# Patient Record
Sex: Female | Born: 1980 | Race: White | Hispanic: No | Marital: Married | Smoking: Former smoker
Health system: Southern US, Community
[De-identification: ages and names within clinical notes are randomized; demographics above are authoritative.]

## PROBLEM LIST (undated history)

## (undated) ENCOUNTER — Inpatient Hospital Stay (HOSPITAL_COMMUNITY): Payer: Self-pay

## (undated) DIAGNOSIS — I10 Essential (primary) hypertension: Secondary | ICD-10-CM

## (undated) HISTORY — PX: OTHER SURGICAL HISTORY: SHX169

## (undated) HISTORY — PX: TONSILLECTOMY: SUR1361

---
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
## 2002-12-14 ENCOUNTER — Other Ambulatory Visit: Admission: RE | Admit: 2002-12-14 | Discharge: 2002-12-14 | Payer: Self-pay | Admitting: Obstetrics and Gynecology

## 2003-12-15 ENCOUNTER — Other Ambulatory Visit: Admission: RE | Admit: 2003-12-15 | Discharge: 2003-12-15 | Payer: Self-pay | Admitting: Obstetrics and Gynecology

## 2004-08-07 ENCOUNTER — Other Ambulatory Visit: Admission: RE | Admit: 2004-08-07 | Discharge: 2004-08-07 | Payer: Self-pay | Admitting: Obstetrics and Gynecology

## 2005-03-04 ENCOUNTER — Other Ambulatory Visit: Admission: RE | Admit: 2005-03-04 | Discharge: 2005-03-04 | Payer: Self-pay | Admitting: Obstetrics and Gynecology

## 2013-03-10 LAB — OB RESULTS CONSOLE RUBELLA ANTIBODY, IGM: Rubella: IMMUNE

## 2013-03-10 LAB — OB RESULTS CONSOLE ANTIBODY SCREEN: Antibody Screen: NEGATIVE

## 2013-03-10 LAB — OB RESULTS CONSOLE HEPATITIS B SURFACE ANTIGEN: Hepatitis B Surface Ag: NEGATIVE

## 2013-03-10 LAB — OB RESULTS CONSOLE ABO/RH: RH Type: POSITIVE

## 2013-03-10 LAB — OB RESULTS CONSOLE RPR: RPR: NONREACTIVE

## 2013-03-10 LAB — OB RESULTS CONSOLE HIV ANTIBODY (ROUTINE TESTING): HIV: NONREACTIVE

## 2013-03-23 LAB — OB RESULTS CONSOLE GC/CHLAMYDIA
Chlamydia: NEGATIVE
Gonorrhea: NEGATIVE

## 2013-09-10 ENCOUNTER — Encounter (HOSPITAL_COMMUNITY): Payer: Self-pay | Admitting: *Deleted

## 2013-09-10 ENCOUNTER — Inpatient Hospital Stay (HOSPITAL_COMMUNITY)
Admission: AD | Admit: 2013-09-10 | Discharge: 2013-09-10 | Disposition: A | Discharge status: Home / Self Care | Payer: 59 | Source: Ambulatory Visit | Attending: Obstetrics and Gynecology | Admitting: Obstetrics and Gynecology

## 2013-09-10 ENCOUNTER — Inpatient Hospital Stay (HOSPITAL_COMMUNITY): Payer: 59

## 2013-09-10 DIAGNOSIS — Z87891 Personal history of nicotine dependence: Secondary | ICD-10-CM

## 2013-09-10 DIAGNOSIS — M549 Dorsalgia, unspecified: Secondary | ICD-10-CM

## 2013-09-10 DIAGNOSIS — IMO0001 Reserved for inherently not codable concepts without codable children: Secondary | ICD-10-CM

## 2013-09-10 DIAGNOSIS — O4700 False labor before 37 completed weeks of gestation, unspecified trimester: Secondary | ICD-10-CM

## 2013-09-10 DIAGNOSIS — M7918 Myalgia, other site: Secondary | ICD-10-CM

## 2013-09-10 HISTORY — DX: Essential (primary) hypertension: I10

## 2013-09-10 LAB — URINALYSIS, ROUTINE W REFLEX MICROSCOPIC
Bilirubin Urine: NEGATIVE
Glucose, UA: NEGATIVE mg/dL
Hgb urine dipstick: NEGATIVE
Ketones, ur: 40 mg/dL — AB
Leukocytes, UA: NEGATIVE
Nitrite: NEGATIVE
PROTEIN: NEGATIVE mg/dL
Specific Gravity, Urine: 1.02 (ref 1.005–1.030)
UROBILINOGEN UA: 0.2 mg/dL (ref 0.0–1.0)
pH: 6.5 (ref 5.0–8.0)

## 2013-09-10 MED ORDER — CYCLOBENZAPRINE HCL 5 MG PO TABS
5.0000 mg | ORAL_TABLET | Freq: Once | ORAL | Status: AC
  Administered 2013-09-10: 5 mg via ORAL
  Filled 2013-09-10: qty 1

## 2013-09-10 MED ORDER — CYCLOBENZAPRINE HCL 10 MG PO TABS: 10.0000 mg | ORAL_TABLET | Freq: Two times a day (BID) | ORAL | Status: DC | PRN

## 2013-09-10 MED ORDER — ONDANSETRON 8 MG PO TBDP
8.0000 mg | ORAL_TABLET | Freq: Once | ORAL | Status: AC
  Administered 2013-09-10: 8 mg via ORAL
  Filled 2013-09-10: qty 1

## 2013-09-10 NOTE — MAU Note (Signed)
PT  SAYS SHE  STARTED HAVING PAIN ON LEFT LOWER BACK. ON Thursday NIGHT     FEEELS WORSE NOW  THAN THEN.    HAS HX OF UTI-  HAS INCREASE FREQ ALL  PREG.    NO VE IN OFFICE.  DENIES HSV AND MRSA.

## 2013-09-10 NOTE — MAU Provider Note (Signed)
History     CSN: [id number]  Arrival date and time: 09/10/13 [id number]   First Provider Initiated Contact with Patient 09/10/13 0522      No chief complaint on file.  HPI Ms. Collier BullockLauren Bole is a 33 y.o. G2P0 at 4984w4d who presents to MAU today with complaint of left sided mid-back pain. The patient states that pain started on Thursday and has become worse since onset. The also endorses occasional dysuria and frequency of urination. She denies urgency, incomplete emptying or hematuria. She had had mild occasional contractions. She denies LOF, discharge, vaginal bleeding or fever. Blood pressure was slightly elevated upon admission. Patient denies previous issues with HTN. She denies headache, blurred vision or floaters or RUQ pain. She does have BLE edema that she states improves with rest.    OB History   Grav Para Term Preterm Abortions TAB SAB Ect Mult Living   2               Past Medical History  Diagnosis Date  . Hypertension     no meds    Past Surgical History  Procedure Laterality Date  . Tonsillectomy    . Wisdom teetth    . Adnoids      History reviewed. No pertinent family history.  History  Substance Use Topics  . Smoking status: Former Games developermoker  . Smokeless tobacco: Not on file  . Alcohol Use: No    Allergies:  Allergies  Allergen Reactions  . Peanuts [Peanut Oil] Other (See Comments)    TESTED  POSTIVE    Prescriptions prior to admission  Medication Sig Dispense Refill  . Multiple Vitamin (MULTIVITAMIN) tablet Take 1 tablet by mouth daily.        Review of Systems  Constitutional: Negative for fever and malaise/fatigue.  Eyes: Negative for blurred vision and double vision.  Cardiovascular: Positive for leg swelling.  Gastrointestinal: Positive for nausea. Negative for vomiting, abdominal pain, diarrhea and constipation.  Genitourinary: Positive for dysuria, frequency and flank pain. Negative for urgency and hematuria.  Neurological: Negative for  headaches.   Physical Exam   Blood pressure 137/88, pulse 107, temperature 98.5 F (36.9 C), temperature source Oral, height 5\' 3"  (1.6 m), weight 185 lb 8 oz (84.142 kg).  Physical Exam  Constitutional: She is oriented to person, place, and time. She appears well-developed and well-nourished. No distress.  HENT:  Head: Normocephalic and atraumatic.  Cardiovascular: Regular rhythm and normal heart sounds.  Tachycardia present.   Respiratory: Effort normal and breath sounds normal. No respiratory distress.  GI: Soft. She exhibits no distension and no mass. There is no tenderness. There is no rebound and no guarding.  Musculoskeletal: She exhibits edema (1+ pitting to the knee bilateraly).  Neurological: She is alert and oriented to person, place, and time.  Reflex Scores:      Bicep reflexes are 2+ on the right side and 2+ on the left side.      Brachioradialis reflexes are 2+ on the right side and 2+ on the left side.      Patellar reflexes are 3+ on the right side and 3+ on the left side.      Achilles reflexes are 2+ on the right side and 2+ on the left side. No clonus  Skin: Skin is warm and dry. No erythema.  Psychiatric: She has a normal mood and affect.  Dilation: Fingertip  Results for orders placed during the hospital encounter of 09/10/13 (from the past 24 hour(s))  URINALYSIS, ROUTINE W REFLEX MICROSCOPIC     Status: Abnormal   Collection Time    09/10/13  5:00 AM      Result Value Ref Range   Color, Urine YELLOW  YELLOW   APPearance CLEAR  CLEAR   Specific Gravity, Urine 1.020  1.005 - 1.030   pH 6.5  5.0 - 8.0   Glucose, UA NEGATIVE  NEGATIVE mg/dL   Hgb urine dipstick NEGATIVE  NEGATIVE   Bilirubin Urine NEGATIVE  NEGATIVE   Ketones, ur 40 (*) NEGATIVE mg/dL   Protein, ur NEGATIVE  NEGATIVE mg/dL   Urobilinogen, UA 0.2  0.0 - 1.0 mg/dL   Nitrite NEGATIVE  NEGATIVE   Leukocytes, UA NEGATIVE  NEGATIVE   Fetal Monitoring: Baseline: 150 bpm, moderate variability,  + accelerations, no decelerations Contractions: mild, q 4 -10 minutes  MAU Course  Procedures None  MDM UA today Discussed patient with Dr. Thana AtesGrewel. Get BPP and check cervix.  BPP today - 8/8 Check cervix - fingertip, thick Discussed results and patient status with Dr. Corinna LinesGrewal. Ok for offer Flexeril. Discuss PTL precautions and patient can follow-up in the office as scheduled Patient had one episode of emesis while in MAU ODT Zofran given prior to Flexeril Dr. Vincente PoliGrewal to MAU to see the patient at 0820; orders received. Ok to discharge patient home.   Assessment and Plan  A: Preterm contractions Back pain in pregnancy, third trimester Musculoskeletal pain in pregnancy   P: Discharge home Rx for Flexeril given to patient Discussed preterm labor precautions Patient advised to follow-up with Dr. Thana AtesGrewel as scheduled or sooner if symptoms persist or worsen Patient may return to MAU as needed or if her condition were to change or worsen  Freddi StarrJulie N Ethier, PA-C  09/10/2013, 7:27 AM   Iona HansenJennifer Irene Kazuto Sevey, NP 09/10/2013. 8:32 AM

## 2013-09-22 LAB — OB RESULTS CONSOLE GBS: STREP GROUP B AG: NEGATIVE

## 2013-10-13 ENCOUNTER — Inpatient Hospital Stay (HOSPITAL_COMMUNITY)
Admission: AD | Admit: 2013-10-13 | Discharge: 2013-10-18 | DRG: 765 | Disposition: A | Discharge status: Home / Self Care | Payer: 59 | Source: Ambulatory Visit | Attending: Obstetrics & Gynecology | Admitting: Obstetrics & Gynecology

## 2013-10-13 ENCOUNTER — Encounter (HOSPITAL_COMMUNITY): Payer: Self-pay | Admitting: *Deleted

## 2013-10-13 ENCOUNTER — Inpatient Hospital Stay (HOSPITAL_COMMUNITY): Payer: 59 | Admitting: Anesthesiology

## 2013-10-13 ENCOUNTER — Encounter (HOSPITAL_COMMUNITY)
Admission: AD | Disposition: A | Discharge status: Home / Self Care | Payer: Self-pay | Source: Ambulatory Visit | Attending: Obstetrics & Gynecology

## 2013-10-13 ENCOUNTER — Encounter (HOSPITAL_COMMUNITY): Payer: 59 | Admitting: Anesthesiology

## 2013-10-13 DIAGNOSIS — O479 False labor, unspecified: Secondary | ICD-10-CM | POA: Diagnosis present

## 2013-10-13 DIAGNOSIS — Z87891 Personal history of nicotine dependence: Secondary | ICD-10-CM

## 2013-10-13 DIAGNOSIS — IMO0002 Reserved for concepts with insufficient information to code with codable children: Secondary | ICD-10-CM | POA: Diagnosis not present

## 2013-10-13 DIAGNOSIS — J45909 Unspecified asthma, uncomplicated: Secondary | ICD-10-CM | POA: Diagnosis present

## 2013-10-13 DIAGNOSIS — Z98891 History of uterine scar from previous surgery: Secondary | ICD-10-CM

## 2013-10-13 DIAGNOSIS — O324XX0 Maternal care for high head at term, not applicable or unspecified: Secondary | ICD-10-CM | POA: Diagnosis present

## 2013-10-13 LAB — CBC
HCT: 37.2 % (ref 36.0–46.0)
HEMOGLOBIN: 12.5 g/dL (ref 12.0–15.0)
MCH: 29.2 pg (ref 26.0–34.0)
MCHC: 33.6 g/dL (ref 30.0–36.0)
MCV: 86.9 fL (ref 78.0–100.0)
Platelets: 268 10*3/uL (ref 150–400)
RBC: 4.28 MIL/uL (ref 3.87–5.11)
RDW: 13.2 % (ref 11.5–15.5)
WBC: 11.3 10*3/uL — AB (ref 4.0–10.5)

## 2013-10-13 LAB — RPR

## 2013-10-13 LAB — POCT FERN TEST: POCT Fern Test: POSITIVE

## 2013-10-13 SURGERY — Surgical Case
Anesthesia: Epidural | Site: Abdomen

## 2013-10-13 MED ORDER — PRENATAL MULTIVITAMIN CH
1.0000 | ORAL_TABLET | Freq: Every day | ORAL | Status: DC
  Administered 2013-10-14 – 2013-10-18 (×5): 1 via ORAL
  Filled 2013-10-13 (×5): qty 1

## 2013-10-13 MED ORDER — PHENYLEPHRINE 40 MCG/ML (10ML) SYRINGE FOR IV PUSH (FOR BLOOD PRESSURE SUPPORT)
80.0000 ug | PREFILLED_SYRINGE | INTRAVENOUS | Status: DC | PRN
  Filled 2013-10-13: qty 2

## 2013-10-13 MED ORDER — SENNOSIDES-DOCUSATE SODIUM 8.6-50 MG PO TABS
2.0000 | ORAL_TABLET | ORAL | Status: DC
  Administered 2013-10-14 – 2013-10-17 (×5): 2 via ORAL
  Filled 2013-10-13 (×5): qty 2

## 2013-10-13 MED ORDER — ONDANSETRON HCL 4 MG/2ML IJ SOLN
INTRAMUSCULAR | Status: AC
  Filled 2013-10-13: qty 2

## 2013-10-13 MED ORDER — TERBUTALINE SULFATE 1 MG/ML IJ SOLN: 0.2500 mg | Freq: Once | INTRAMUSCULAR | Status: DC | PRN

## 2013-10-13 MED ORDER — SODIUM BICARBONATE 8.4 % IV SOLN
INTRAVENOUS | Status: AC
  Filled 2013-10-13: qty 50

## 2013-10-13 MED ORDER — OXYTOCIN 10 UNIT/ML IJ SOLN
40.0000 [IU] | INTRAVENOUS | Status: DC | PRN
  Administered 2013-10-13: 40 [IU] via INTRAVENOUS

## 2013-10-13 MED ORDER — PHENYLEPHRINE 40 MCG/ML (10ML) SYRINGE FOR IV PUSH (FOR BLOOD PRESSURE SUPPORT)
PREFILLED_SYRINGE | INTRAVENOUS | Status: AC
  Filled 2013-10-13: qty 10

## 2013-10-13 MED ORDER — DIPHENHYDRAMINE HCL 25 MG PO CAPS: 25.0000 mg | ORAL_CAPSULE | Freq: Four times a day (QID) | ORAL | Status: DC | PRN

## 2013-10-13 MED ORDER — NALOXONE HCL 0.4 MG/ML IJ SOLN: 0.4000 mg | INTRAMUSCULAR | Status: DC | PRN

## 2013-10-13 MED ORDER — ONDANSETRON HCL 4 MG/2ML IJ SOLN: 4.0000 mg | Freq: Three times a day (TID) | INTRAMUSCULAR | Status: DC | PRN

## 2013-10-13 MED ORDER — FENTANYL 2.5 MCG/ML BUPIVACAINE 1/10 % EPIDURAL INFUSION (WH - ANES)
INTRAMUSCULAR | Status: AC
  Administered 2013-10-13: 14 mL/h via EPIDURAL
  Filled 2013-10-13: qty 125

## 2013-10-13 MED ORDER — ONDANSETRON HCL 4 MG PO TABS
4.0000 mg | ORAL_TABLET | ORAL | Status: DC | PRN
  Administered 2013-10-15: 4 mg via ORAL
  Filled 2013-10-13: qty 1

## 2013-10-13 MED ORDER — EPHEDRINE 5 MG/ML INJ
10.0000 mg | INTRAVENOUS | Status: DC | PRN
  Filled 2013-10-13: qty 2

## 2013-10-13 MED ORDER — METOCLOPRAMIDE HCL 5 MG/ML IJ SOLN
10.0000 mg | Freq: Three times a day (TID) | INTRAMUSCULAR | Status: DC | PRN
  Administered 2013-10-13: 10 mg via INTRAVENOUS
  Filled 2013-10-13: qty 2

## 2013-10-13 MED ORDER — EPHEDRINE 5 MG/ML INJ
INTRAVENOUS | Status: AC
  Filled 2013-10-13: qty 4

## 2013-10-13 MED ORDER — NALOXONE HCL 1 MG/ML IJ SOLN
1.0000 ug/kg/h | INTRAVENOUS | Status: DC | PRN
  Filled 2013-10-13: qty 2

## 2013-10-13 MED ORDER — MEPERIDINE HCL 25 MG/ML IJ SOLN: 6.2500 mg | INTRAMUSCULAR | Status: DC | PRN

## 2013-10-13 MED ORDER — SIMETHICONE 80 MG PO CHEW
80.0000 mg | CHEWABLE_TABLET | Freq: Three times a day (TID) | ORAL | Status: DC
  Administered 2013-10-14 – 2013-10-18 (×11): 80 mg via ORAL
  Filled 2013-10-13 (×11): qty 1

## 2013-10-13 MED ORDER — PROMETHAZINE HCL 25 MG/ML IJ SOLN: 6.2500 mg | INTRAMUSCULAR | Status: DC | PRN

## 2013-10-13 MED ORDER — LACTATED RINGERS IV SOLN
INTRAVENOUS | Status: DC
  Administered 2013-10-15 – 2013-10-17 (×5): via INTRAVENOUS

## 2013-10-13 MED ORDER — LACTATED RINGERS IV SOLN
500.0000 mL | Freq: Once | INTRAVENOUS | Status: AC
  Administered 2013-10-13: 1000 mL via INTRAVENOUS

## 2013-10-13 MED ORDER — MORPHINE SULFATE 0.5 MG/ML IJ SOLN
INTRAMUSCULAR | Status: AC
  Filled 2013-10-13: qty 10

## 2013-10-13 MED ORDER — LANOLIN HYDROUS EX OINT: 1.0000 "application " | TOPICAL_OINTMENT | CUTANEOUS | Status: DC | PRN

## 2013-10-13 MED ORDER — NALBUPHINE HCL 10 MG/ML IJ SOLN: 5.0000 mg | INTRAMUSCULAR | Status: DC | PRN

## 2013-10-13 MED ORDER — OXYTOCIN 10 UNIT/ML IJ SOLN
INTRAMUSCULAR | Status: AC
  Filled 2013-10-13: qty 4

## 2013-10-13 MED ORDER — KETOROLAC TROMETHAMINE 30 MG/ML IJ SOLN: 30.0000 mg | Freq: Four times a day (QID) | INTRAMUSCULAR | Status: AC | PRN

## 2013-10-13 MED ORDER — IBUPROFEN 600 MG PO TABS: 600.0000 mg | ORAL_TABLET | Freq: Four times a day (QID) | ORAL | Status: DC | PRN

## 2013-10-13 MED ORDER — DIPHENHYDRAMINE HCL 50 MG/ML IJ SOLN
25.0000 mg | INTRAMUSCULAR | Status: DC | PRN
Start: 2013-10-13 — End: 2013-10-18

## 2013-10-13 MED ORDER — SODIUM CHLORIDE 0.9 % IJ SOLN: 3.0000 mL | INTRAMUSCULAR | Status: DC | PRN

## 2013-10-13 MED ORDER — SIMETHICONE 80 MG PO CHEW: 80.0000 mg | CHEWABLE_TABLET | ORAL | Status: DC | PRN

## 2013-10-13 MED ORDER — KETOROLAC TROMETHAMINE 30 MG/ML IJ SOLN
30.0000 mg | Freq: Four times a day (QID) | INTRAMUSCULAR | Status: AC | PRN
  Administered 2013-10-13 – 2013-10-14 (×2): 30 mg via INTRAVENOUS
  Filled 2013-10-13: qty 1

## 2013-10-13 MED ORDER — LIDOCAINE HCL (PF) 1 % IJ SOLN
INTRAMUSCULAR | Status: DC | PRN
  Administered 2013-10-13 (×2): 5 mL

## 2013-10-13 MED ORDER — LACTATED RINGERS IV SOLN: 500.0000 mL | INTRAVENOUS | Status: DC | PRN

## 2013-10-13 MED ORDER — SODIUM BICARBONATE 8.4 % IV SOLN
INTRAVENOUS | Status: DC | PRN
  Administered 2013-10-13 (×4): 5 mL via EPIDURAL

## 2013-10-13 MED ORDER — HYDROMORPHONE HCL PF 1 MG/ML IJ SOLN: 0.2500 mg | INTRAMUSCULAR | Status: DC | PRN

## 2013-10-13 MED ORDER — MORPHINE SULFATE (PF) 0.5 MG/ML IJ SOLN
INTRAMUSCULAR | Status: DC | PRN
  Administered 2013-10-13: 4 mg via EPIDURAL

## 2013-10-13 MED ORDER — ZOLPIDEM TARTRATE 5 MG PO TABS: 5.0000 mg | ORAL_TABLET | Freq: Every evening | ORAL | Status: DC | PRN

## 2013-10-13 MED ORDER — WITCH HAZEL-GLYCERIN EX PADS: 1.0000 "application " | MEDICATED_PAD | CUTANEOUS | Status: DC | PRN

## 2013-10-13 MED ORDER — FENTANYL CITRATE 0.05 MG/ML IJ SOLN
INTRAMUSCULAR | Status: DC | PRN
  Administered 2013-10-13 (×2): 25 ug via INTRAVENOUS
  Administered 2013-10-13: 50 ug via INTRAVENOUS

## 2013-10-13 MED ORDER — OXYTOCIN BOLUS FROM INFUSION: 500.0000 mL | INTRAVENOUS | Status: DC

## 2013-10-13 MED ORDER — DIPHENHYDRAMINE HCL 25 MG PO CAPS: 25.0000 mg | ORAL_CAPSULE | ORAL | Status: DC | PRN

## 2013-10-13 MED ORDER — ACETAMINOPHEN 325 MG PO TABS: 650.0000 mg | ORAL_TABLET | ORAL | Status: DC | PRN

## 2013-10-13 MED ORDER — ONDANSETRON HCL 4 MG/2ML IJ SOLN: 4.0000 mg | Freq: Four times a day (QID) | INTRAMUSCULAR | Status: DC | PRN

## 2013-10-13 MED ORDER — OXYTOCIN 40 UNITS IN LACTATED RINGERS INFUSION - SIMPLE MED
62.5000 mL/h | INTRAVENOUS | Status: DC
  Filled 2013-10-13: qty 1000

## 2013-10-13 MED ORDER — CHLOROPROCAINE HCL 3 % IJ SOLN
INTRAMUSCULAR | Status: DC | PRN
  Administered 2013-10-13: 20 mL

## 2013-10-13 MED ORDER — FENTANYL CITRATE 0.05 MG/ML IJ SOLN
INTRAMUSCULAR | Status: AC
  Filled 2013-10-13: qty 2

## 2013-10-13 MED ORDER — CEFAZOLIN SODIUM-DEXTROSE 2-3 GM-% IV SOLR
2.0000 g | Freq: Once | INTRAVENOUS | Status: AC
  Administered 2013-10-13: 2 g via INTRAVENOUS
  Filled 2013-10-13: qty 50

## 2013-10-13 MED ORDER — OXYCODONE-ACETAMINOPHEN 5-325 MG PO TABS: 1.0000 | ORAL_TABLET | ORAL | Status: DC | PRN

## 2013-10-13 MED ORDER — ONDANSETRON HCL 4 MG/2ML IJ SOLN
4.0000 mg | INTRAMUSCULAR | Status: DC | PRN
Start: 2013-10-13 — End: 2013-10-18

## 2013-10-13 MED ORDER — KETOROLAC TROMETHAMINE 30 MG/ML IJ SOLN: 15.0000 mg | Freq: Once | INTRAMUSCULAR | Status: DC | PRN

## 2013-10-13 MED ORDER — CHLOROPROCAINE HCL 3 % IJ SOLN
INTRAMUSCULAR | Status: AC
  Filled 2013-10-13: qty 20

## 2013-10-13 MED ORDER — OXYCODONE-ACETAMINOPHEN 5-325 MG PO TABS
1.0000 | ORAL_TABLET | ORAL | Status: DC | PRN
  Administered 2013-10-14 – 2013-10-18 (×12): 1 via ORAL
  Filled 2013-10-13 (×13): qty 1

## 2013-10-13 MED ORDER — OXYTOCIN 40 UNITS IN LACTATED RINGERS INFUSION - SIMPLE MED
62.5000 mL/h | INTRAVENOUS | Status: AC
Start: 2013-10-13 — End: 2013-10-14

## 2013-10-13 MED ORDER — OXYTOCIN 40 UNITS IN LACTATED RINGERS INFUSION - SIMPLE MED
1.0000 m[IU]/min | INTRAVENOUS | Status: DC
  Administered 2013-10-13: 2 m[IU]/min via INTRAVENOUS

## 2013-10-13 MED ORDER — SCOPOLAMINE 1 MG/3DAYS TD PT72
MEDICATED_PATCH | TRANSDERMAL | Status: AC
  Filled 2013-10-13: qty 1

## 2013-10-13 MED ORDER — FLEET ENEMA 7-19 GM/118ML RE ENEM: 1.0000 | ENEMA | RECTAL | Status: DC | PRN

## 2013-10-13 MED ORDER — LACTATED RINGERS IV SOLN
INTRAVENOUS | Status: DC
  Administered 2013-10-13 (×4): via INTRAVENOUS

## 2013-10-13 MED ORDER — SCOPOLAMINE 1 MG/3DAYS TD PT72
1.0000 | MEDICATED_PATCH | Freq: Once | TRANSDERMAL | Status: AC
  Administered 2013-10-13: 1.5 mg via TRANSDERMAL

## 2013-10-13 MED ORDER — LIDOCAINE-EPINEPHRINE (PF) 2 %-1:200000 IJ SOLN
INTRAMUSCULAR | Status: AC
  Filled 2013-10-13: qty 20

## 2013-10-13 MED ORDER — TETANUS-DIPHTH-ACELL PERTUSSIS 5-2.5-18.5 LF-MCG/0.5 IM SUSP
0.5000 mL | Freq: Once | INTRAMUSCULAR | Status: DC
Start: 2013-10-14 — End: 2013-10-18

## 2013-10-13 MED ORDER — DIBUCAINE 1 % RE OINT: 1.0000 "application " | TOPICAL_OINTMENT | RECTAL | Status: DC | PRN

## 2013-10-13 MED ORDER — LIDOCAINE HCL (PF) 1 % IJ SOLN: 30.0000 mL | INTRAMUSCULAR | Status: DC | PRN

## 2013-10-13 MED ORDER — DIPHENHYDRAMINE HCL 50 MG/ML IJ SOLN
12.5000 mg | INTRAMUSCULAR | Status: DC | PRN
Start: 2013-10-13 — End: 2013-10-18

## 2013-10-13 MED ORDER — KETOROLAC TROMETHAMINE 30 MG/ML IJ SOLN
INTRAMUSCULAR | Status: AC
Start: 2013-10-13 — End: 2013-10-13
  Administered 2013-10-13: 30 mg via INTRAVENOUS
  Filled 2013-10-13: qty 1

## 2013-10-13 MED ORDER — DIPHENHYDRAMINE HCL 50 MG/ML IJ SOLN: 12.5000 mg | INTRAMUSCULAR | Status: DC | PRN

## 2013-10-13 MED ORDER — SODIUM CHLORIDE 0.9 % IR SOLN
Status: DC | PRN
  Administered 2013-10-13: 1000 mL

## 2013-10-13 MED ORDER — CITRIC ACID-SODIUM CITRATE 334-500 MG/5ML PO SOLN
30.0000 mL | ORAL | Status: DC | PRN
  Administered 2013-10-13: 30 mL via ORAL
  Filled 2013-10-13: qty 15

## 2013-10-13 MED ORDER — IBUPROFEN 600 MG PO TABS
600.0000 mg | ORAL_TABLET | Freq: Four times a day (QID) | ORAL | Status: DC | PRN
  Administered 2013-10-16 – 2013-10-18 (×2): 600 mg via ORAL

## 2013-10-13 MED ORDER — SIMETHICONE 80 MG PO CHEW
80.0000 mg | CHEWABLE_TABLET | ORAL | Status: DC
  Administered 2013-10-14 – 2013-10-17 (×5): 80 mg via ORAL
  Filled 2013-10-13 (×5): qty 1

## 2013-10-13 MED ORDER — IBUPROFEN 600 MG PO TABS
600.0000 mg | ORAL_TABLET | Freq: Four times a day (QID) | ORAL | Status: DC
  Administered 2013-10-14 – 2013-10-18 (×15): 600 mg via ORAL
  Filled 2013-10-13 (×17): qty 1

## 2013-10-13 MED ORDER — MORPHINE SULFATE (PF) 0.5 MG/ML IJ SOLN
INTRAMUSCULAR | Status: DC | PRN
  Administered 2013-10-13: 1 mg via INTRAVENOUS

## 2013-10-13 MED ORDER — FENTANYL 2.5 MCG/ML BUPIVACAINE 1/10 % EPIDURAL INFUSION (WH - ANES)
14.0000 mL/h | INTRAMUSCULAR | Status: DC | PRN
  Administered 2013-10-13: 14 mL/h via EPIDURAL
  Filled 2013-10-13: qty 125

## 2013-10-13 MED ORDER — MENTHOL 3 MG MT LOZG: 1.0000 | LOZENGE | OROMUCOSAL | Status: DC | PRN

## 2013-10-13 MED ORDER — ONDANSETRON HCL 4 MG/2ML IJ SOLN
INTRAMUSCULAR | Status: DC | PRN
  Administered 2013-10-13: 4 mg via INTRAVENOUS

## 2013-10-13 SURGICAL SUPPLY — 33 items
ADH SKN CLS APL DERMABOND .7 (GAUZE/BANDAGES/DRESSINGS)
CLAMP CORD UMBIL (MISCELLANEOUS) IMPLANT
CLOTH BEACON ORANGE TIMEOUT ST (SAFETY) ×3 IMPLANT
DERMABOND ADVANCED (GAUZE/BANDAGES/DRESSINGS)
DERMABOND ADVANCED .7 DNX12 (GAUZE/BANDAGES/DRESSINGS) IMPLANT
DRAPE LG THREE QUARTER DISP (DRAPES) IMPLANT
DRSG OPSITE POSTOP 4X10 (GAUZE/BANDAGES/DRESSINGS) ×5 IMPLANT
DURAPREP 26ML APPLICATOR (WOUND CARE) ×3 IMPLANT
ELECT REM PT RETURN 9FT ADLT (ELECTROSURGICAL) ×3
ELECTRODE REM PT RTRN 9FT ADLT (ELECTROSURGICAL) ×1 IMPLANT
EXTRACTOR VACUUM KIWI (MISCELLANEOUS) IMPLANT
EXTRACTOR VACUUM M CUP 4 TUBE (SUCTIONS) IMPLANT
EXTRACTOR VACUUM M CUP 4' TUBE (SUCTIONS)
GLOVE BIO SURGEON STRL SZ 6 (GLOVE) ×3 IMPLANT
GLOVE BIOGEL PI IND STRL 6 (GLOVE) ×2 IMPLANT
GLOVE BIOGEL PI INDICATOR 6 (GLOVE) ×4
GOWN STRL REUS W/TWL LRG LVL3 (GOWN DISPOSABLE) ×6 IMPLANT
KIT ABG SYR 3ML LUER SLIP (SYRINGE) ×3 IMPLANT
NDL HYPO 25X5/8 SAFETYGLIDE (NEEDLE) ×1 IMPLANT
NEEDLE HYPO 25X5/8 SAFETYGLIDE (NEEDLE) ×3 IMPLANT
NS IRRIG 1000ML POUR BTL (IV SOLUTION) ×3 IMPLANT
PACK C SECTION WH (CUSTOM PROCEDURE TRAY) ×3 IMPLANT
PAD OB MATERNITY 4.3X12.25 (PERSONAL CARE ITEMS) ×3 IMPLANT
STAPLER VISISTAT 35W (STAPLE) IMPLANT
SUT CHROMIC 0 CTX 36 (SUTURE) ×9 IMPLANT
SUT MON AB 2-0 CT1 27 (SUTURE) ×3 IMPLANT
SUT PDS AB 0 CT1 27 (SUTURE) ×2 IMPLANT
SUT PLAIN 0 NONE (SUTURE) IMPLANT
SUT VIC AB 0 CT1 36 (SUTURE) IMPLANT
SUT VIC AB 4-0 KS 27 (SUTURE) ×2 IMPLANT
TOWEL OR 17X24 6PK STRL BLUE (TOWEL DISPOSABLE) ×3 IMPLANT
TRAY FOLEY CATH 14FR (SET/KITS/TRAYS/PACK) IMPLANT
WATER STERILE IRR 1000ML POUR (IV SOLUTION) ×3 IMPLANT

## 2013-10-13 NOTE — Anesthesia Procedure Notes (Signed)
Epidural Patient location during procedure: OB Start time: 10/13/2013 8:30 AM  Staffing Anesthesiologist: Brayton CavesJACKSON, Keenan Dimitrov Performed by: anesthesiologist   Preanesthetic Checklist Completed: patient identified, site marked, surgical consent, pre-op evaluation, timeout performed, IV checked, risks and benefits discussed and monitors and equipment checked  Epidural Patient position: sitting Prep: site prepped and draped and DuraPrep Patient monitoring: continuous pulse ox and blood pressure Approach: midline Location: L3-L4 Injection technique: LOR air  Needle:  Needle type: Tuohy  Needle gauge: 17 G Needle length: 9 cm and 9 Needle insertion depth: 5 cm cm Catheter type: closed end flexible Catheter size: 19 Gauge Catheter at skin depth: 10 cm Test dose: negative  Assessment Events: blood not aspirated, injection not painful, no injection resistance, negative IV test and no paresthesia  Additional Notes Patient identified.  Risk benefits discussed including failed block, incomplete pain control, headache, nerve damage, paralysis, blood pressure changes, nausea, vomiting, reactions to medication both toxic or allergic, and postpartum back pain.  Patient expressed understanding and wished to proceed.  All questions were answered.  Sterile technique used throughout procedure and epidural site dressed with sterile barrier dressing. No paresthesia or other complications noted.The patient did not experience any signs of intravascular injection such as tinnitus or metallic taste in mouth nor signs of intrathecal spread such as rapid motor block. Please see nursing notes for vital signs.

## 2013-10-13 NOTE — Anesthesia Preprocedure Evaluation (Signed)
Anesthesia Evaluation  Patient identified by MRN, date of birth, ID band Patient awake    Reviewed: Allergy & Precautions, H&P , Patient's Chart, lab work & pertinent test results  Airway Mallampati: II TM Distance: >3 FB Neck ROM: full    Dental   Pulmonary former smoker,  breath sounds clear to auscultation        Cardiovascular hypertension, Rhythm:regular Rate:Normal     Neuro/Psych    GI/Hepatic   Endo/Other    Renal/GU      Musculoskeletal   Abdominal   Peds  Hematology   Anesthesia Other Findings   Reproductive/Obstetrics (+) Pregnancy                           Anesthesia Physical Anesthesia Plan  ASA: III  Anesthesia Plan: Epidural   Post-op Pain Management:    Induction:   Airway Management Planned:   Additional Equipment:   Intra-op Plan:   Post-operative Plan:   Informed Consent: I have reviewed the patients History and Physical, chart, labs and discussed the procedure including the risks, benefits and alternatives for the proposed anesthesia with the patient or authorized representative who has indicated his/her understanding and acceptance.     Plan Discussed with:   Anesthesia Plan Comments:         Anesthesia Quick Evaluation

## 2013-10-13 NOTE — H&P (Signed)
Kristen Lynn is a 33 y.o. female presenting for ROM at 0300 this AM.  CTX started 30-45 minutes after ROM.  Currently, patient is having increasing pain and wants epidural.  No VB.  +FM.  Antepartum course has been uncomplicated.  Patient has asthma which has been well-controlled this pregnancy.  GBS negative.   Maternal Medical History:  Reason for admission: Rupture of membranes.   Contractions: Onset was 3-5 hours ago.   Frequency: irregular.   Perceived severity is mild.    Fetal activity: Perceived fetal activity is normal.   Last perceived fetal movement was within the past hour.    Prenatal complications: no prenatal complications Prenatal Complications - Diabetes: none.    OB History   Grav Para Term Preterm Abortions TAB SAB Ect Mult Living   1              Past Medical History  Diagnosis Date  . Hypertension     no meds   Past Surgical History  Procedure Laterality Date  . Tonsillectomy    . Wisdom teetth    . Adnoids     Family History: family history is not on file. Social History:  reports that she has quit smoking. She does not have any smokeless tobacco history on file. She reports that she does not drink alcohol or use illicit drugs.   Prenatal Transfer Tool  Maternal Diabetes: No Genetic Screening: Normal Maternal Ultrasounds/Referrals: Normal Fetal Ultrasounds or other Referrals:  None Maternal Substance Abuse:  No Significant Maternal Medications:  None Significant Maternal Lab Results:  Lab values include: Group B Strep negative Other Comments:  None  ROS  Dilation: 1 Effacement (%): 90 Station: -2 Exam by:: A. Gagliardo, RN Blood pressure 147/94, pulse 86, temperature 97.4 F (36.3 C), temperature source Oral, resp. rate 18, height 5' 3.5" (1.613 m), weight 194 lb (87.998 kg). Maternal Exam:  Uterine Assessment: Contraction strength is mild.  Contraction frequency is irregular.   Abdomen: Fundal height is c/w dates.   Estimated fetal  weight is 7#8.       Physical Exam  Constitutional: She is oriented to person, place, and time. She appears well-developed and well-nourished.  GI: Soft. There is no tenderness. There is no rebound and no guarding.  Neurological: She is alert and oriented to person, place, and time.  Skin: Skin is warm and dry.  Psychiatric: She has a normal mood and affect. Her behavior is normal.    Prenatal labs: ABO, Rh: AB/Positive/-- (11/13 0000) Antibody: Negative (11/13 0000) Rubella: Immune (11/13 0000) RPR: Nonreactive (11/13 0000)  HBsAg: Negative (11/13 0000)  HIV: Non-reactive (11/13 0000)  GBS: Negative (05/28 0000)   Assessment/Plan: 32yo G1 at 5143w2d with labor -Epidural  -Add pitocin as needed -Anticipate NSVD   Lynn, Kristen 10/13/2013, 7:57 AM

## 2013-10-13 NOTE — Transfer of Care (Signed)
Immediate Anesthesia Transfer of Care Note  Patient: Kristen BullockLauren Lynn  Procedure(s) Performed: Procedure(s): CESAREAN SECTION (N/A)  Patient Location: PACU  Anesthesia Type:Epidural  Level of Consciousness: awake  Airway & Oxygen Therapy: Patient Spontanous Breathing and Patient connected to nasal cannula oxygen  Post-op Assessment: Report given to PACU RN  Post vital signs: Reviewed and stable  Complications: No apparent anesthesia complications

## 2013-10-13 NOTE — Progress Notes (Signed)
Welford RocheJessica Thornton, RN took over care of patient

## 2013-10-13 NOTE — Anesthesia Postprocedure Evaluation (Signed)
Anesthesia Post Note  Patient: Kristen BullockLauren Lynn  Procedure(s) Performed: Procedure(s) (LRB): CESAREAN SECTION (N/A)  Anesthesia type: Epidural  Patient location: PACU  Post pain: Pain level controlled  Post assessment: Post-op Vital signs reviewed  Last Vitals:  Filed Vitals:   10/13/13 2145  BP: 127/81  Pulse: 110  Temp:   Resp: 18    Post vital signs: Reviewed  Level of consciousness: awake  Complications: No apparent anesthesia complications

## 2013-10-13 NOTE — Op Note (Signed)
Kristen BullockLauren Vanderlinden PROCEDURE DATE: 10/13/2013  PREOPERATIVE DIAGNOSIS: Intrauterine pregnancy at  4547w2d weeks gestation with arrest of descent  POSTOPERATIVE DIAGNOSIS: The same  PROCEDURE:    Low Transverse Cesarean Section  SURGEON:  Dr. Mitchel HonourMegan Morris  INDICATIONS: Kristen BullockLauren Remington is a 33 y.o. G1P0 at 7347w2d scheduled for cesarean section secondary to arrest of descent.  The risks of cesarean section discussed with the patient included but were not limited to: bleeding which may require transfusion or reoperation; infection which may require antibiotics; injury to bowel, bladder, ureters or other surrounding organs; injury to the fetus; need for additional procedures including hysterectomy in the event of a life-threatening hemorrhage; placental abnormalities wth subsequent pregnancies, incisional problems, thromboembolic phenomenon and other postoperative/anesthesia complications. The patient concurred with the proposed plan, giving informed written consent for the procedure.    FINDINGS:  Viable female infant in cephalic presentation, APGARs 9,9:  Weight pending Clear amniotic fluid.  Intact placenta, three vessel cord.  Grossly normal uterus, ovaries and fallopian tubes. .   ANESTHESIA:    Epidural ESTIMATED BLOOD LOSS: 800 ml SPECIMENS: Placenta sent to L&D COMPLICATIONS: None immediate  PROCEDURE IN DETAIL:  The patient received intravenous antibiotics and had sequential compression devices applied to her lower extremities while in the preoperative area.  She was then taken to the operating room where epidural anesthesia was dosed up to surgical level) and was found to be adequate. She was then placed in a dorsal supine position with a leftward tilt, and prepped and draped in a sterile manner.  A foley catheter was placed into her bladder and attached to constant gravity.  After an adequate timeout was performed, a Pfannenstiel skin incision was made with scalpel and carried through to the underlying  layer of fascia. The fascia was incised in the midline and this incision was extended bilaterally using the Mayo scissors. Kocher clamps were applied to the superior aspect of the fascial incision and the underlying rectus muscles were dissected off bluntly. A similar process was carried out on the inferior aspect of the facial incision. The rectus muscles were separated in the midline bluntly and the peritoneum was entered bluntly.  A bladder flap was created sharply and developed bluntly.  The bladder was protected behind the bladder blade.  A transverse hysterotomy was made with a scalpel and extended bilaterally bluntly. The bladder blade was then removed. The infant was successfully delivered, and cord was clamped and cut and infant was handed over to awaiting neonatology team. Uterine massage was then administered and the placenta delivered intact with three-vessel cord. The uterus was cleared of clot and debris.  The hysterotomy was closed with 0 Chromic.  A second imbricating suture of 0-Chromic was used to reinforce the incision and aid in hemostasis.  The peritoneum and rectus muscles were noted to be hemostatic and were reapproximated using 3-0 Monocryl in a subcuticular fashion.  The fascia was closed with 0-Vicryl in a running fashion with good restoration of anatomy.  The subcutaneus tissue was copiously irrigated.  The skin was closed with 4-0 Vicryl in a subcuticular fashion.  Pt tolerated the procedure will.  All counts were correct x2.  Pt went to the recovery room in stable condition.

## 2013-10-13 NOTE — MAU Note (Signed)
Pt states her water broke around 0250 with clear fluid.  Some contractions.  No VB.

## 2013-10-13 NOTE — Progress Notes (Signed)
Patient has been pushing effectively x 1.5 hours.  There has been no descent from c/c/+2.  She is requesting C/S secondary to exhaustion and discomfort with epidural.  She declines anesthesia evaluation and taking a break.  Patient is counseled for C/S including bleeding, infection, scarring, and damage to surrounding structures.  She understands the implications in future pregnancies.  All questions were answered and she wishes to proceed.    Mitchel HonourMegan Morris, DO

## 2013-10-13 NOTE — Consult Note (Signed)
Neonatology Note:   Attendance at C-section:    I was asked by Dr. Langston MaskerMorris to attend this primary C/S at term [facility]. The mother is a G1P0 AB pos, GBS neg with an uncomplicated pregnancy. ROM 17 hours prior to delivery, fluid clear. Infant vigorous with good spontaneous cry and tone. Needed only minimal bulb suctioning. Ap 9/9. Lungs clear to ausc in DR. To CN to care of Pediatrician.   Doretha Souhristie C. DaVanzo, MD

## 2013-10-14 ENCOUNTER — Encounter (HOSPITAL_COMMUNITY): Payer: Self-pay | Admitting: Obstetrics & Gynecology

## 2013-10-14 LAB — COMPREHENSIVE METABOLIC PANEL
ALBUMIN: 2.4 g/dL — AB (ref 3.5–5.2)
ALBUMIN: 2.5 g/dL — AB (ref 3.5–5.2)
ALK PHOS: 171 U/L — AB (ref 39–117)
ALT: 12 U/L (ref 0–35)
ALT: 16 U/L (ref 0–35)
AST: 23 U/L (ref 0–37)
AST: 29 U/L (ref 0–37)
Alkaline Phosphatase: 173 U/L — ABNORMAL HIGH (ref 39–117)
BILIRUBIN TOTAL: 0.4 mg/dL (ref 0.3–1.2)
BUN: 11 mg/dL (ref 6–23)
BUN: 11 mg/dL (ref 6–23)
CHLORIDE: 105 meq/L (ref 96–112)
CO2: 22 mEq/L (ref 19–32)
CO2: 22 mEq/L (ref 19–32)
Calcium: 8.5 mg/dL (ref 8.4–10.5)
Calcium: 8.5 mg/dL (ref 8.4–10.5)
Chloride: 104 mEq/L (ref 96–112)
Creatinine, Ser: 1.12 mg/dL — ABNORMAL HIGH (ref 0.50–1.10)
Creatinine, Ser: 1.03 mg/dL (ref 0.50–1.10)
GFR calc Af Amer: 75 mL/min — ABNORMAL LOW (ref >90)
GFR calc Af Amer: 83 mL/min — ABNORMAL LOW (ref >90)
GFR calc non Af Amer: 65 mL/min — ABNORMAL LOW (ref >90)
GFR calc non Af Amer: 72 mL/min — ABNORMAL LOW (ref >90)
Glucose, Bld: 97 mg/dL (ref 70–99)
Glucose, Bld: 93 mg/dL (ref 70–99)
POTASSIUM: 4.6 meq/L (ref 3.7–5.3)
POTASSIUM: 4.4 meq/L (ref 3.7–5.3)
SODIUM: 138 meq/L (ref 137–147)
Sodium: 139 mEq/L (ref 137–147)
TOTAL PROTEIN: 5.6 g/dL — AB (ref 6.0–8.3)
Total Bilirubin: 0.3 mg/dL (ref 0.3–1.2)
Total Protein: 4.9 g/dL — ABNORMAL LOW (ref 6.0–8.3)

## 2013-10-14 LAB — CBC
HCT: 28.6 % — ABNORMAL LOW (ref 36.0–46.0)
HCT: 29.4 % — ABNORMAL LOW (ref 36.0–46.0)
Hemoglobin: 9.6 g/dL — ABNORMAL LOW (ref 12.0–15.0)
Hemoglobin: 9.8 g/dL — ABNORMAL LOW (ref 12.0–15.0)
MCH: 29.1 pg (ref 26.0–34.0)
MCH: 29.3 pg (ref 26.0–34.0)
MCHC: 33.6 g/dL (ref 30.0–36.0)
MCHC: 33.3 g/dL (ref 30.0–36.0)
MCV: 86.7 fL (ref 78.0–100.0)
MCV: 87.8 fL (ref 78.0–100.0)
PLATELETS: 222 10*3/uL (ref 150–400)
PLATELETS: 236 10*3/uL (ref 150–400)
RBC: 3.3 MIL/uL — ABNORMAL LOW (ref 3.87–5.11)
RBC: 3.35 MIL/uL — ABNORMAL LOW (ref 3.87–5.11)
RDW: 13.4 % (ref 11.5–15.5)
RDW: 13.5 % (ref 11.5–15.5)
WBC: 19 10*3/uL — ABNORMAL HIGH (ref 4.0–10.5)
WBC: 18.9 10*3/uL — ABNORMAL HIGH (ref 4.0–10.5)

## 2013-10-14 LAB — URIC ACID: URIC ACID, SERUM: 7.4 mg/dL — AB (ref 2.4–7.0)

## 2013-10-14 MED ORDER — MEPERIDINE HCL 25 MG/ML IJ SOLN: 6.2500 mg | Freq: Once | INTRAMUSCULAR | Status: DC

## 2013-10-14 NOTE — Anesthesia Postprocedure Evaluation (Signed)
  Anesthesia Post-op Note  Patient: Kristen BullockLauren Kroeze  Procedure(s) Performed: Procedure(s): CESAREAN SECTION (N/A)  Patient Location: Mother/Baby  Anesthesia Type:Epidural  Level of Consciousness: awake  Airway and Oxygen Therapy: Patient Spontanous Breathing  Post-op Pain: mild  Post-op Assessment: Patient's Cardiovascular Status Stable and Respiratory Function Stable  Post-op Vital Signs: stable  Last Vitals:  Filed Vitals:   10/14/13 0855  BP: 118/77  Pulse: 80  Temp: 37 C  Resp: 18    Complications: No apparent anesthesia complications

## 2013-10-14 NOTE — Anesthesia Postprocedure Evaluation (Signed)
  Anesthesia Post-op Note  Patient: Kristen Lynn  Procedure(s) Performed: Procedure(s): CESAREAN SECTION (N/A)  Patient Location: Mother/Baby  Anesthesia Type:Epidural  Level of Consciousness: awake  Airway and Oxygen Therapy: Patient Spontanous Breathing  Post-op Pain: mild  Post-op Assessment: Patient's Cardiovascular Status Stable and Respiratory Function Stable  Post-op Vital Signs: stable  Last Vitals:  Filed Vitals:   10/14/13 0610  BP: 135/86  Pulse: 88  Temp: 36.8 C  Resp: 16    Complications: No apparent anesthesia complications

## 2013-10-14 NOTE — Progress Notes (Signed)
Results for orders placed during the hospital encounter of 10/13/13 (from the past 24 hour(s))  COMPREHENSIVE METABOLIC PANEL     Status: Abnormal   Collection Time    10/14/13  6:05 AM      Result Value Ref Range   Sodium 139  137 - 147 mEq/L   Potassium 4.6  3.7 - 5.3 mEq/L   Chloride 105  96 - 112 mEq/L   CO2 22  19 - 32 mEq/L   Glucose, Bld 97  70 - 99 mg/dL   BUN 11  6 - 23 mg/dL   Creatinine, Ser 9.321.12 (*) 0.50 - 1.10 mg/dL   Calcium 8.5  8.4 - 35.510.5 mg/dL   Total Protein 4.9 (*) 6.0 - 8.3 g/dL   Albumin 2.4 (*) 3.5 - 5.2 g/dL   AST 23  0 - 37 U/L   ALT 12  0 - 35 U/L   Alkaline Phosphatase 173 (*) 39 - 117 U/L   Total Bilirubin 0.4  0.3 - 1.2 mg/dL   GFR calc non Af Amer 65 (*) >90 mL/min   GFR calc Af Amer 75 (*) >90 mL/min  CBC     Status: Abnormal   Collection Time    10/14/13  6:05 AM      Result Value Ref Range   WBC 19.0 (*) 4.0 - 10.5 K/uL   RBC 3.30 (*) 3.87 - 5.11 MIL/uL   Hemoglobin 9.6 (*) 12.0 - 15.0 g/dL   HCT 73.228.6 (*) 20.236.0 - 54.246.0 %   MCV 86.7  78.0 - 100.0 fL   MCH 29.1  26.0 - 34.0 pg   MCHC 33.6  30.0 - 36.0 g/dL   RDW 70.613.4  23.711.5 - 62.815.5 %   Platelets 222  150 - 400 K/uL    Repeat labs this afternoon

## 2013-10-14 NOTE — Addendum Note (Signed)
Addendum created 10/14/13 0740 by Renford DillsJanet L Mullins, CRNA   Modules edited: Notes Section   Notes Section:  File: [id number]

## 2013-10-14 NOTE — Addendum Note (Signed)
Addendum created 10/14/13 1042 by Renford DillsJanet L Mullins, CRNA   Modules edited: Notes Section   Notes Section:  File: [id number]

## 2013-10-14 NOTE — Progress Notes (Signed)
Subjective: Postpartum Day 1: Cesarean Delivery Patient reports tolerating PO and + flatus.    Objective: Vital signs in last 24 hours: Temp:  [98.3 F (36.8 C)-99.8 F (37.7 C)] 98.3 F (36.8 C) (06/19 0610) Pulse Rate:  [79-116] 88 (06/19 0610) Resp:  [16-22] 16 (06/19 0610) BP: (118-163)/(69-97) 135/86 mmHg (06/19 0610) SpO2:  [94 %-100 %] 96 % (06/19 0610)  Physical Exam:  General: alert and cooperative Lochia: appropriate Uterine Fundus: firm Incision: honeycomb dressing CDI DVT Evaluation: No evidence of DVT seen on physical exam. Negative Homan's sign. No cords or calf tenderness. Calf/Ankle edema is present.   Recent Labs  10/13/13 0453 10/14/13 0605  HGB 12.5 9.6*  HCT 37.2 28.6*    Assessment/Plan: Status post Cesarean section. Doing well postoperatively.  Continue current care.  [name],[name] 10/14/2013, 8:18 AM

## 2013-10-14 NOTE — Lactation Note (Signed)
This note was copied from the chart of Girl Collier BullockLauren Presswood. Lactation Consultation Note  Patient Name: Girl Collier BullockLauren Kobs QMVHQ'IToday's Date: 10/14/2013 Reason for consult: Initial assessment  Baby 21 hours of life. Mom states baby sleepy and not wanting to stay latched. Assisted mom to express colostrum from breast. Assisted mom to latch baby in football position on left breast. Baby roots away from breast and is fussy. Enc mom to keep attempting to latch and wait for baby to calm. LC latched baby for mom and baby sucked for a few minutes with deep latch, rhythmic suckling and a few swallows. Baby drops off to sleep, and has to be re-latched. Re-latched baby several times. Enc mom to offer lots of STS and hand express colostrum into baby's mouth to enc baby to latch deeply. Enc mom to alternate the breast that she starts the nursing session with. Enc FOB to help mom with baby's hands when latching. Enc mom to ask for assistance with latching as needed. [facility], aware of OP/BFSG and community resources. Enc mom to feed with cues, and at least 8-12 times/24 hours. Maternal Data Has patient been taught Hand Expression?: Yes Does the patient have breastfeeding experience prior to this delivery?: No  Feeding Feeding Type: Breast Fed Length of feed: 10 min  LATCH Score/Interventions Latch: Repeated attempts needed to sustain latch, nipple held in mouth throughout feeding, stimulation needed to elicit sucking reflex. Intervention(s): Adjust position;Assist with latch;Breast massage;Breast compression  Audible Swallowing: A few with stimulation  Type of Nipple: Everted at rest and after stimulation  Comfort (Breast/Nipple): Soft / non-tender     Hold (Positioning): Assistance needed to correctly position infant at breast and maintain latch. Intervention(s): Breastfeeding basics reviewed  LATCH Score: 7  Lactation Tools Discussed/Used     Consult Status Consult Status:  Follow-up Date: 10/15/13 Follow-up type: In-patient    Geralynn OchsWILLIARD, JENNIFER 10/14/2013, 5:34 PM

## 2013-10-15 LAB — COMPREHENSIVE METABOLIC PANEL
ALT: 17 U/L (ref 0–35)
AST: 29 U/L (ref 0–37)
Albumin: 2.3 g/dL — ABNORMAL LOW (ref 3.5–5.2)
Alkaline Phosphatase: 142 U/L — ABNORMAL HIGH (ref 39–117)
BUN: 9 mg/dL (ref 6–23)
CALCIUM: 8.4 mg/dL (ref 8.4–10.5)
CO2: 23 meq/L (ref 19–32)
Chloride: 107 mEq/L (ref 96–112)
Creatinine, Ser: 0.86 mg/dL (ref 0.50–1.10)
GFR, EST NON AFRICAN AMERICAN: 89 mL/min — AB (ref >90)
GLUCOSE: 87 mg/dL (ref 70–99)
Potassium: 4.3 mEq/L (ref 3.7–5.3)
SODIUM: 141 meq/L (ref 137–147)
Total Bilirubin: 0.2 mg/dL — ABNORMAL LOW (ref 0.3–1.2)
Total Protein: 5 g/dL — ABNORMAL LOW (ref 6.0–8.3)

## 2013-10-15 LAB — CBC
HCT: 26.6 % — ABNORMAL LOW (ref 36.0–46.0)
HEMOGLOBIN: 8.7 g/dL — AB (ref 12.0–15.0)
MCH: 28.4 pg (ref 26.0–34.0)
MCHC: 32.7 g/dL (ref 30.0–36.0)
MCV: 86.9 fL (ref 78.0–100.0)
Platelets: 229 10*3/uL (ref 150–400)
RBC: 3.06 MIL/uL — AB (ref 3.87–5.11)
RDW: 13.6 % (ref 11.5–15.5)
WBC: 13.5 10*3/uL — ABNORMAL HIGH (ref 4.0–10.5)

## 2013-10-15 LAB — URIC ACID: URIC ACID, SERUM: 7.1 mg/dL — AB (ref 2.4–7.0)

## 2013-10-15 MED ORDER — MAGNESIUM SULFATE 40 G IN LACTATED RINGERS - SIMPLE
2.0000 g/h | INTRAVENOUS | Status: DC
Start: 2013-10-15 — End: 2013-10-17
  Administered 2013-10-15 – 2013-10-16 (×2): 2 g/h via INTRAVENOUS
  Filled 2013-10-15 (×2): qty 500

## 2013-10-15 MED ORDER — MAGNESIUM SULFATE BOLUS VIA INFUSION
4.0000 g | Freq: Once | INTRAVENOUS | Status: AC
  Administered 2013-10-15: 4 g via INTRAVENOUS
  Filled 2013-10-15: qty 500

## 2013-10-15 NOTE — Progress Notes (Signed)
No HA or vision changes, no epigastric pain  Filed Vitals:   10/15/13 1249  BP: 152/99  Pulse: 76  Temp:   Resp:    Lungs CTA Cor RRR Abd soft, no epigastric pain DTR 3+, 1-2 beats of clonus  Results for orders placed during the hospital encounter of 10/13/13 (from the past 24 hour(s))  CBC     Status: Abnormal   Collection Time    10/15/13 12:15 PM      Result Value Ref Range   WBC 13.5 (*) 4.0 - 10.5 K/uL   RBC 3.06 (*) 3.87 - 5.11 MIL/uL   Hemoglobin 8.7 (*) 12.0 - 15.0 g/dL   HCT 16.126.6 (*) 09.636.0 - 04.546.0 %   MCV 86.9  78.0 - 100.0 fL   MCH 28.4  26.0 - 34.0 pg   MCHC 32.7  30.0 - 36.0 g/dL   RDW 40.913.6  81.111.5 - 91.415.5 %   Platelets 229  150 - 400 K/uL  COMPREHENSIVE METABOLIC PANEL     Status: Abnormal   Collection Time    10/15/13 12:15 PM      Result Value Ref Range   Sodium 141  137 - 147 mEq/L   Potassium 4.3  3.7 - 5.3 mEq/L   Chloride 107  96 - 112 mEq/L   CO2 23  19 - 32 mEq/L   Glucose, Bld 87  70 - 99 mg/dL   BUN 9  6 - 23 mg/dL   Creatinine, Ser 7.820.86  0.50 - 1.10 mg/dL   Calcium 8.4  8.4 - 95.610.5 mg/dL   Total Protein 5.0 (*) 6.0 - 8.3 g/dL   Albumin 2.3 (*) 3.5 - 5.2 g/dL   AST 29  0 - 37 U/L   ALT 17  0 - 35 U/L   Alkaline Phosphatase 142 (*) 39 - 117 U/L   Total Bilirubin 0.2 (*) 0.3 - 1.2 mg/dL   GFR calc non Af Amer 89 (*) >90 mL/min   GFR calc Af Amer >90  >90 mL/min  URIC ACID     Status: Abnormal   Collection Time    10/15/13 12:15 PM      Result Value Ref Range   Uric Acid, Serum 7.1 (*) 2.4 - 7.0 mg/dL   A: preeclampsia  P: magnesium sulfate for seizure prophylaxis     D/W patient and husband above and side effects

## 2013-10-15 NOTE — Progress Notes (Signed)
Patient's B/P 152/99, one beat of clonus felt in left foot, reflexes 2+, patient slightly nauseated, no other PIH S&S. Notified Md on-call Dr.Tomblin.

## 2013-10-15 NOTE — Progress Notes (Signed)
Checked patient's B/P at 1045 it was 147/94. Notified Dr. Henderson Cloudomblin. Orders taken for B/P parameters for when to call MD and to check B/P's every two hours.

## 2013-10-15 NOTE — Progress Notes (Signed)
Ambulating, + flatus, voiding, good pain relief No HA, no epigastric pain, no blurry vision  BP 120-140s/86-94 Lungs CTA Cor RRR Abd soft, BS + Dressing C&D Ext NT DTR 2+ without clonus   Results for orders placed during the hospital encounter of 10/13/13 (from the past 24 hour(s))  CBC     Status: Abnormal   Collection Time    10/14/13 12:27 PM      Result Value Ref Range   WBC 18.9 (*) 4.0 - 10.5 K/uL   RBC 3.35 (*) 3.87 - 5.11 MIL/uL   Hemoglobin 9.8 (*) 12.0 - 15.0 g/dL   HCT 40.929.4 (*) 81.136.0 - 91.446.0 %   MCV 87.8  78.0 - 100.0 fL   MCH 29.3  26.0 - 34.0 pg   MCHC 33.3  30.0 - 36.0 g/dL   RDW 78.213.5  95.611.5 - 21.315.5 %   Platelets 236  150 - 400 K/uL  COMPREHENSIVE METABOLIC PANEL     Status: Abnormal   Collection Time    10/14/13 12:27 PM      Result Value Ref Range   Sodium 138  137 - 147 mEq/L   Potassium 4.4  3.7 - 5.3 mEq/L   Chloride 104  96 - 112 mEq/L   CO2 22  19 - 32 mEq/L   Glucose, Bld 93  70 - 99 mg/dL   BUN 11  6 - 23 mg/dL   Creatinine, Ser 0.861.03  0.50 - 1.10 mg/dL   Calcium 8.5  8.4 - 57.810.5 mg/dL   Total Protein 5.6 (*) 6.0 - 8.3 g/dL   Albumin 2.5 (*) 3.5 - 5.2 g/dL   AST 29  0 - 37 U/L   ALT 16  0 - 35 U/L   Alkaline Phosphatase 171 (*) 39 - 117 U/L   Total Bilirubin 0.3  0.3 - 1.2 mg/dL   GFR calc non Af Amer 72 (*) >90 mL/min   GFR calc Af Amer 83 (*) >90 mL/min  URIC ACID     Status: Abnormal   Collection Time    10/14/13 12:27 PM      Result Value Ref Range   Uric Acid, Serum 7.4 (*) 2.4 - 7.0 mg/dL   A: Labile BP      Doubt preeclampsia at this time  P: Watch BP closely

## 2013-10-15 NOTE — Progress Notes (Signed)
Pt wanting to breastfeed. IV stick postponed.

## 2013-10-16 LAB — CBC
HCT: 24.2 % — ABNORMAL LOW (ref 36.0–46.0)
Hemoglobin: 7.9 g/dL — ABNORMAL LOW (ref 12.0–15.0)
MCH: 28.4 pg (ref 26.0–34.0)
MCHC: 32.6 g/dL (ref 30.0–36.0)
MCV: 87.1 fL (ref 78.0–100.0)
Platelets: 224 K/uL (ref 150–400)
RBC: 2.78 MIL/uL — ABNORMAL LOW (ref 3.87–5.11)
RDW: 13.5 % (ref 11.5–15.5)
WBC: 9.7 K/uL (ref 4.0–10.5)

## 2013-10-16 LAB — COMPREHENSIVE METABOLIC PANEL
ALBUMIN: 2.2 g/dL — AB (ref 3.5–5.2)
ALT: 17 U/L (ref 0–35)
AST: 25 U/L (ref 0–37)
Alkaline Phosphatase: 129 U/L — ABNORMAL HIGH (ref 39–117)
BUN: 8 mg/dL (ref 6–23)
CO2: 24 mEq/L (ref 19–32)
Calcium: 6.9 mg/dL — ABNORMAL LOW (ref 8.4–10.5)
Chloride: 107 mEq/L (ref 96–112)
Creatinine, Ser: 0.87 mg/dL (ref 0.50–1.10)
GFR calc Af Amer: >90 mL/min (ref >90)
GFR calc non Af Amer: 88 mL/min — ABNORMAL LOW (ref >90)
Glucose, Bld: 96 mg/dL (ref 70–99)
POTASSIUM: 3.6 meq/L — AB (ref 3.7–5.3)
SODIUM: 143 meq/L (ref 137–147)
TOTAL PROTEIN: 4.6 g/dL — AB (ref 6.0–8.3)
Total Bilirubin: <0.2 mg/dL — ABNORMAL LOW (ref 0.3–1.2)

## 2013-10-16 LAB — URIC ACID: Uric Acid, Serum: 6.7 mg/dL (ref 2.4–7.0)

## 2013-10-16 MED ORDER — LABETALOL HCL 200 MG PO TABS
200.0000 mg | ORAL_TABLET | Freq: Three times a day (TID) | ORAL | Status: DC
  Filled 2013-10-16: qty 1

## 2013-10-16 MED ORDER — LABETALOL HCL 100 MG PO TABS
100.0000 mg | ORAL_TABLET | Freq: Once | ORAL | Status: AC
  Administered 2013-10-16: 100 mg via ORAL
  Filled 2013-10-16: qty 1

## 2013-10-16 MED ORDER — LABETALOL HCL 200 MG PO TABS
200.0000 mg | ORAL_TABLET | Freq: Three times a day (TID) | ORAL | Status: DC
  Administered 2013-10-16: 200 mg via ORAL
  Filled 2013-10-16 (×3): qty 1

## 2013-10-16 MED ORDER — LABETALOL HCL 100 MG PO TABS
100.0000 mg | ORAL_TABLET | Freq: Two times a day (BID) | ORAL | Status: DC
  Administered 2013-10-16 (×2): 100 mg via ORAL
  Filled 2013-10-16 (×2): qty 1

## 2013-10-16 MED ORDER — LABETALOL HCL 200 MG PO TABS
300.0000 mg | ORAL_TABLET | Freq: Three times a day (TID) | ORAL | Status: DC
  Administered 2013-10-16 – 2013-10-17 (×4): 300 mg via ORAL
  Filled 2013-10-16 (×5): qty 1

## 2013-10-16 NOTE — Progress Notes (Signed)
10/16/13 0912  Vitals  BP ! 155/104 mmHg  MAP (mmHg) 118  Pulse Rate 97  Resp 16  Oxygen Therapy  SpO2 97 %  O2 Device None (Room air)  scheduled Labetalol given po

## 2013-10-16 NOTE — Progress Notes (Signed)
No HA, no vision change  Started on Labetalol 100mg  po last pm  BP 155/104 this am before am dose of labetalol, 146/96 after am dose of 100mg  Good UO  Lungs CTA Cor RRR Abd soft , good BS, incision healing well  Results for orders placed during the hospital encounter of 10/13/13 (from the past 24 hour(s))  CBC     Status: Abnormal   Collection Time    10/15/13 12:15 PM      Result Value Ref Range   WBC 13.5 (*) 4.0 - 10.5 K/uL   RBC 3.06 (*) 3.87 - 5.11 MIL/uL   Hemoglobin 8.7 (*) 12.0 - 15.0 g/dL   HCT 40.926.6 (*) 81.136.0 - 91.446.0 %   MCV 86.9  78.0 - 100.0 fL   MCH 28.4  26.0 - 34.0 pg   MCHC 32.7  30.0 - 36.0 g/dL   RDW 78.213.6  95.611.5 - 21.315.5 %   Platelets 229  150 - 400 K/uL  COMPREHENSIVE METABOLIC PANEL     Status: Abnormal   Collection Time    10/15/13 12:15 PM      Result Value Ref Range   Sodium 141  137 - 147 mEq/L   Potassium 4.3  3.7 - 5.3 mEq/L   Chloride 107  96 - 112 mEq/L   CO2 23  19 - 32 mEq/L   Glucose, Bld 87  70 - 99 mg/dL   BUN 9  6 - 23 mg/dL   Creatinine, Ser 0.860.86  0.50 - 1.10 mg/dL   Calcium 8.4  8.4 - 57.810.5 mg/dL   Total Protein 5.0 (*) 6.0 - 8.3 g/dL   Albumin 2.3 (*) 3.5 - 5.2 g/dL   AST 29  0 - 37 U/L   ALT 17  0 - 35 U/L   Alkaline Phosphatase 142 (*) 39 - 117 U/L   Total Bilirubin 0.2 (*) 0.3 - 1.2 mg/dL   GFR calc non Af Amer 89 (*) >90 mL/min   GFR calc Af Amer >90  >90 mL/min  URIC ACID     Status: Abnormal   Collection Time    10/15/13 12:15 PM      Result Value Ref Range   Uric Acid, Serum 7.1 (*) 2.4 - 7.0 mg/dL  CBC     Status: Abnormal   Collection Time    10/16/13  5:23 AM      Result Value Ref Range   WBC 9.7  4.0 - 10.5 K/uL   RBC 2.78 (*) 3.87 - 5.11 MIL/uL   Hemoglobin 7.9 (*) 12.0 - 15.0 g/dL   HCT 46.924.2 (*) 62.936.0 - 52.846.0 %   MCV 87.1  78.0 - 100.0 fL   MCH 28.4  26.0 - 34.0 pg   MCHC 32.6  30.0 - 36.0 g/dL   RDW 41.313.5  24.411.5 - 01.015.5 %   Platelets 224  150 - 400 K/uL  COMPREHENSIVE METABOLIC PANEL     Status: Abnormal   Collection Time    10/16/13  5:23 AM      Result Value Ref Range   Sodium 143  137 - 147 mEq/L   Potassium 3.6 (*) 3.7 - 5.3 mEq/L   Chloride 107  96 - 112 mEq/L   CO2 24  19 - 32 mEq/L   Glucose, Bld 96  70 - 99 mg/dL   BUN 8  6 - 23 mg/dL   Creatinine, Ser 2.720.87  0.50 - 1.10 mg/dL   Calcium 6.9 (*) 8.4 - 10.5  mg/dL   Total Protein 4.6 (*) 6.0 - 8.3 g/dL   Albumin 2.2 (*) 3.5 - 5.2 g/dL   AST 25  0 - 37 U/L   ALT 17  0 - 35 U/L   Alkaline Phosphatase 129 (*) 39 - 117 U/L   Total Bilirubin <0.2 (*) 0.3 - 1.2 mg/dL   GFR calc non Af Amer 88 (*) >90 mL/min   GFR calc Af Amer >90  >90 mL/min  URIC ACID     Status: None   Collection Time    10/16/13  5:23 AM      Result Value Ref Range   Uric Acid, Serum 6.7  2.4 - 7.0 mg/dL    A: Preeclampsia     Fhx of [facility]  P: Continue magnesium sulfate      Increase labetalol to 200mg  tid

## 2013-10-17 MED ORDER — HYDRALAZINE HCL 20 MG/ML IJ SOLN
10.0000 mg | Freq: Once | INTRAMUSCULAR | Status: AC
  Administered 2013-10-17: 10 mg via INTRAVENOUS
  Filled 2013-10-17: qty 1

## 2013-10-17 NOTE — Progress Notes (Signed)
This note also relates to the following rows which could not be included: BP - Cannot attach notes to unvalidated device data MAP (mmHg) - Cannot attach notes to unvalidated device data Pulse Rate - Cannot attach notes to unvalidated device data   Po labetalol given

## 2013-10-17 NOTE — Lactation Note (Signed)
This note was copied from the chart of Kristen Collier BullockLauren Carvalho. Lactation Consultation Note    Follow up consult with this mom of a term baby, now 6687 hours old. Baby is at 11% weight loss, but mom's milk is in, and has easily expressed transitional milk.[facility] still [facility], [facility]. Mom reports breast feeding going well. Baby has sttols that are beginning to transition to green, and lots of wet diapers. Mom knows to call for questions/concerns. Mom how to hand express, reviewed feeding with cues, and encouraged  To increase feedings.  Patient Name: Kristen Collier BullockLauren Bertz [id number]'s Date: 10/17/2013 Reason for consult: Follow-up assessment   Maternal Data    Feeding    LATCH Score/Interventions                      Lactation Tools Discussed/Used     Consult Status Consult Status: Follow-up Date: 10/18/13 Follow-up type: In-patient    Alfred LevinsLee, Christine Anne 10/17/2013, 11:45 AM

## 2013-10-17 NOTE — Progress Notes (Addendum)
Dr. Arelia SneddonMcComb notified of blood pressure of 161/101. Instructed to give labetalol 300mg  that was scheduled for 2200 stat. Pt reports not blurred vision, headache, or gastric pain. Notify MD for future BPs >160/110.

## 2013-10-17 NOTE — Progress Notes (Signed)
Subjective: Postpartum Day four: Cesarean Delivery Patient reports tolerating PO.    Objective: Vital signs in last 24 hours: Temp:  [98.1 F (36.7 C)-98.8 F (37.1 C)] 98.1 F (36.7 C) (06/22 0000) Pulse Rate:  [73-99] 88 (06/22 0755) Resp:  [16-20] 16 (06/21 2020) BP: (124-155)/(81-104) 147/99 mmHg (06/22 0755) SpO2:  [93 %-99 %] 94 % (06/22 0755) Weight:  [82.146 kg (181 lb 1.6 oz)] 82.146 kg (181 lb 1.6 oz) (06/21 0941)  Physical Exam:  General: alert Lochia: appropriate Uterine Fundus: firm Incision: healing well DVT Evaluation: No evidence of DVT seen on physical exam.   Recent Labs  10/15/13 1215 10/16/13 0523  HGB 8.7* 7.9*  HCT 26.6* 24.2*    Assessment/Plan: Status post Cesarean section. Postoperative course complicated by pre eclampsia  Discontinue magnesium.  MCCOMB,JOHN S 10/17/2013, 8:32 AM

## 2013-10-18 MED ORDER — IBUPROFEN 600 MG PO TABS: 600.0000 mg | ORAL_TABLET | Freq: Four times a day (QID) | ORAL | Status: DC | PRN

## 2013-10-18 MED ORDER — OXYCODONE-ACETAMINOPHEN 5-325 MG PO TABS: 1.0000 | ORAL_TABLET | ORAL | Status: DC | PRN

## 2013-10-18 MED ORDER — LABETALOL HCL 200 MG PO TABS
400.0000 mg | ORAL_TABLET | Freq: Three times a day (TID) | ORAL | Status: DC
  Administered 2013-10-18: 400 mg via ORAL
  Filled 2013-10-18: qty 2

## 2013-10-18 MED ORDER — LABETALOL HCL 200 MG PO TABS: 400.0000 mg | ORAL_TABLET | Freq: Three times a day (TID) | ORAL | Status: DC

## 2013-10-18 NOTE — Lactation Note (Signed)
This note was copied from the chart of Kristen [name]. Lactation Consultation Note  Patient Name: Kristen [name] ZOXWR'UToday's Date: 10/18/2013 Reason for consult: Follow-up assessment Mom reports baby is nursing well. Weight loss at 10% but and increase in weight since yesterday. Several void/stools. Mom's breasts are filling but not engorged. Mom reports some difficulty with football hold. Baby recently fed but was awake and LC assisted Mom with latching baby in this position. LC encouraged Mom to pre-pump due to breasts filling and aerola becoming not as compressible. Advised this will soften the aerola for better latch and get the milk flow going to help baby not be fussy/impatient at the breast. Engorgement care reviewed if needed. Advised of OP services and support group. Some basic teaching reviewed.   Maternal Data Formula Feeding for Exclusion: No  Feeding Feeding Type: Breast Fed Length of feed: 5 min  LATCH Score/Interventions Latch: Grasps breast easily, tongue down, lips flanged, rhythmical sucking. Intervention(s): Adjust position;Assist with latch;Breast massage;Breast compression  Audible Swallowing: A few with stimulation Intervention(s): Skin to skin Intervention(s): Hand expression  Type of Nipple: Everted at rest and after stimulation  Comfort (Breast/Nipple): Filling, red/small blisters or bruises, mild/mod discomfort  Problem noted: Filling Interventions (Filling): Frequent nursing;Massage;Firm support;Hand pump;Double electric pump  Hold (Positioning): Assistance needed to correctly position infant at breast and maintain latch. Intervention(s): Breastfeeding basics reviewed;Support Pillows;Position options;Skin to skin  LATCH Score: 7  Lactation Tools Discussed/Used     Consult Status Consult Status: Complete Date: 10/18/13 Follow-up type: In-patient    Alfred LevinsGranger, [name] 10/18/2013, 10:06 AM

## 2013-10-18 NOTE — Discharge Summary (Signed)
Obstetric Discharge Summary Reason for Admission: rupture of membranes Prenatal Procedures: ultrasound Intrapartum Procedures: cesarean: low cervical, transverse Postpartum Procedures: none Complications-Operative and Postpartum: pp PIH Hemoglobin  Date Value Ref Range Status  10/16/2013 7.9* 12.0 - 15.0 Lynn/dL Final     HCT  Date Value Ref Range Status  10/16/2013 24.2* 36.0 - 46.0 % Final    Physical Exam:  General: alert and cooperative Lochia: appropriate Uterine Fundus: firm Incision: healing well DVT Evaluation: No evidence of DVT seen on physical exam. Negative Homan's sign. No cords or calf tenderness. Calf/Ankle edema is present.  Discharge Diagnoses: Term Pregnancy-delivered and Preelampsia  Discharge Information: Date: 10/18/2013 Activity: pelvic rest Diet: routine Medications: PNV, Ibuprofen, Percocet and labetalol Condition: stable Instructions: refer to practice specific booklet Discharge to: home, RTO in 1 week for BP check   Newborn Data: Live born female  Birth Weight: 8 lb 6.6 oz ([id number] Lynn) APGAR: 9, 9  Home with mother.  Kristen Lynn 10/18/2013, 12:31 PM

## 2013-10-18 NOTE — Progress Notes (Signed)
Subjective: Postpartum Day 5: Cesarean Delivery Patient reports tolerating PO, + flatus, + BM and no problems voiding.    Objective: Vital signs in last 24 hours: Temp:  [97.4 F (36.3 C)-98.5 F (36.9 C)] 97.4 F (36.3 C) (06/22 1600) Pulse Rate:  [67-91] 67 (06/23 0803) Resp:  [18-20] 20 (06/23 0803) BP: (134-173)/(92-109) 162/95 mmHg (06/23 0803) SpO2:  [97 %-98 %] 98 % (06/22 1600) Weight:  [181 lb 8 oz (82.328 kg)] 181 lb 8 oz (82.328 kg) (06/22 1000)  Physical Exam:  General: alert and cooperative , denies HA, blurred vision Lochia: appropriate Uterine Fundus: firm Incision: healing well, honeycomb dressing CDI DVT Evaluation: No evidence of DVT seen on physical exam. Negative Homan's sign. No cords or calf tenderness. Calf/Ankle edema is present. DTR's 3+, no clonus  No RUQ tenderness  Recent Labs  10/15/13 1215 10/16/13 0523  HGB 8.7* 7.9*  HCT 26.6* 24.2*    Assessment/Plan: Status post Cesarean section. Postoperative course complicated by PIH  Change Labetalol to 400 mg TID Consider later discharge if BP improves.  [name],[name] 10/18/2013, 8:36 AM

## 2014-02-27 ENCOUNTER — Encounter (HOSPITAL_COMMUNITY): Payer: Self-pay | Admitting: Obstetrics & Gynecology

## 2015-11-21 ENCOUNTER — Ambulatory Visit (INDEPENDENT_AMBULATORY_CARE_PROVIDER_SITE_OTHER): Payer: 59 | Admitting: Podiatry

## 2015-11-21 ENCOUNTER — Encounter: Payer: Self-pay | Admitting: Podiatry

## 2015-11-21 ENCOUNTER — Ambulatory Visit (INDEPENDENT_AMBULATORY_CARE_PROVIDER_SITE_OTHER): Payer: 59

## 2015-11-21 VITALS — BP 115/72 | HR 68 | Resp 16 | Ht 63.0 in | Wt 170.0 lb

## 2015-11-21 DIAGNOSIS — M79672 Pain in left foot: Secondary | ICD-10-CM

## 2015-11-21 DIAGNOSIS — M79671 Pain in right foot: Secondary | ICD-10-CM | POA: Diagnosis not present

## 2015-11-21 DIAGNOSIS — M722 Plantar fascial fibromatosis: Secondary | ICD-10-CM | POA: Diagnosis not present

## 2015-11-21 MED ORDER — TRIAMCINOLONE ACETONIDE 10 MG/ML IJ SUSP
10.0000 mg | Freq: Once | INTRAMUSCULAR | Status: AC
  Administered 2015-11-21: 10 mg

## 2015-11-21 MED ORDER — DICLOFENAC SODIUM 75 MG PO TBEC: 75.0000 mg | DELAYED_RELEASE_TABLET | Freq: Two times a day (BID) | ORAL | 2 refills | Status: AC

## 2015-11-21 NOTE — Progress Notes (Signed)
   Subjective:    Patient ID: Kristen Lynn, female    DOB: [birth year], 35 y.o.   MRN: [id number]  HPI Chief Complaint  Patient presents with  . Foot Pain    Bilateral; heel; x2 months      Review of Systems  All other systems reviewed and are negative.      Objective:   Physical Exam        Assessment & Plan:

## 2015-11-21 NOTE — Patient Instructions (Signed)

## 2015-11-21 NOTE — Progress Notes (Signed)
Subjective:     Patient ID: Collier Bullock, female   DOB: 1981/02/13, 35 y.o.   MRN: [id number]  HPI patient presents I have a lot of pain in the plantar aspect of both my heels making it hard for me to do any type of activity and it's been present for several months   Review of Systems  All other systems reviewed and are negative.      Objective:   Physical Exam  Constitutional: She is oriented to person, place, and time.  Cardiovascular: Intact distal pulses.   Musculoskeletal: Normal range of motion.  Neurological: She is oriented to person, place, and time.  Skin: Skin is warm.  Nursing note and vitals reviewed.  neurovascular status intact muscle strength adequate range of motion within normal limits with patient found to have significant discomfort plantar aspect heel region bilateral with inflammation and fluid around the medial band. 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 Placed on oral anti-inflammatory at this time and instructed on reduced activity and physical therapy  X-rays indicated small spur formation bilateral with no indication stress fracture arthritis

## 2015-12-05 ENCOUNTER — Ambulatory Visit (INDEPENDENT_AMBULATORY_CARE_PROVIDER_SITE_OTHER): Payer: 59 | Admitting: Podiatry

## 2015-12-05 DIAGNOSIS — M722 Plantar fascial fibromatosis: Secondary | ICD-10-CM | POA: Diagnosis not present

## 2015-12-05 NOTE — Progress Notes (Signed)
Subjective:     Patient ID: Kristen BullockLauren Lynn, female   DOB: [birth year], 35 y.o.   MRN: [id number]  HPI patient states my pain has improved dramatically and is still present but is much better than previously   Review of Systems     Objective:   Physical Exam Neurovascular status intact muscle strength intact with patient found to have diminished discomfort plantar heel region bilateral with pain still present but approximately 80-90% improvement    Assessment:     Significant improvement in plantar fasciitis heel region bilateral    Plan:     H&P condition reviewed and at this time recommended the continuation of physical therapy anti-inflammatories and not going barefoot. I spent a great of time talking to her about what to do if a flareup should occur and went to see me and she'll be seen back as needed
